# Patient Record
Sex: Female | Born: 1940 | Race: White | Hispanic: No | Marital: Married | Smoking: Current every day smoker
Health system: Southern US, Community
[De-identification: ages and names within clinical notes are randomized; demographics above are authoritative.]

## PROBLEM LIST (undated history)

## (undated) DIAGNOSIS — R350 Frequency of micturition: Secondary | ICD-10-CM

## (undated) DIAGNOSIS — R5381 Other malaise: Secondary | ICD-10-CM

## (undated) DIAGNOSIS — E559 Vitamin D deficiency, unspecified: Secondary | ICD-10-CM

## (undated) DIAGNOSIS — M545 Low back pain, unspecified: Secondary | ICD-10-CM

## (undated) DIAGNOSIS — R946 Abnormal results of thyroid function studies: Secondary | ICD-10-CM

## (undated) DIAGNOSIS — K900 Celiac disease: Secondary | ICD-10-CM

## (undated) DIAGNOSIS — M2550 Pain in unspecified joint: Secondary | ICD-10-CM

## (undated) DIAGNOSIS — K219 Gastro-esophageal reflux disease without esophagitis: Secondary | ICD-10-CM

## (undated) DIAGNOSIS — R5383 Other fatigue: Secondary | ICD-10-CM

## (undated) DIAGNOSIS — R51 Headache: Secondary | ICD-10-CM

## (undated) DIAGNOSIS — N951 Menopausal and female climacteric states: Secondary | ICD-10-CM

## (undated) DIAGNOSIS — M1990 Unspecified osteoarthritis, unspecified site: Secondary | ICD-10-CM

## (undated) DIAGNOSIS — N3090 Cystitis, unspecified without hematuria: Secondary | ICD-10-CM

## (undated) DIAGNOSIS — J301 Allergic rhinitis due to pollen: Secondary | ICD-10-CM

## (undated) DIAGNOSIS — M8580 Other specified disorders of bone density and structure, unspecified site: Secondary | ICD-10-CM

## (undated) DIAGNOSIS — I00 Rheumatic fever without heart involvement: Secondary | ICD-10-CM

## (undated) DIAGNOSIS — J45909 Unspecified asthma, uncomplicated: Secondary | ICD-10-CM

## (undated) DIAGNOSIS — I059 Rheumatic mitral valve disease, unspecified: Secondary | ICD-10-CM

## (undated) HISTORY — PX: TONSILLECTOMY: SUR1361

## (undated) HISTORY — DX: Pain in unspecified joint: M25.50

## (undated) HISTORY — DX: Other malaise: R53.81

## (undated) HISTORY — DX: Menopausal and female climacteric states: N95.1

## (undated) HISTORY — DX: Allergic rhinitis due to pollen: J30.1

## (undated) HISTORY — DX: Unspecified osteoarthritis, unspecified site: M19.90

## (undated) HISTORY — DX: Celiac disease: K90.0

## (undated) HISTORY — DX: Rheumatic mitral valve disease, unspecified: I05.9

## (undated) HISTORY — DX: Other specified disorders of bone density and structure, unspecified site: M85.80

## (undated) HISTORY — DX: Rheumatic fever without heart involvement: I00

## (undated) HISTORY — PX: TOTAL ABDOMINAL HYSTERECTOMY W/ BILATERAL SALPINGOOPHORECTOMY: SHX83

## (undated) HISTORY — DX: Other fatigue: R53.83

## (undated) HISTORY — DX: Low back pain, unspecified: M54.50

## (undated) HISTORY — PX: APPENDECTOMY: SHX54

## (undated) HISTORY — DX: Low back pain: M54.5

## (undated) HISTORY — DX: Frequency of micturition: R35.0

## (undated) HISTORY — DX: Headache: R51

## (undated) HISTORY — DX: Abnormal results of thyroid function studies: R94.6

## (undated) HISTORY — DX: Cystitis, unspecified without hematuria: N30.90

## (undated) HISTORY — DX: Gastro-esophageal reflux disease without esophagitis: K21.9

## (undated) HISTORY — DX: Unspecified asthma, uncomplicated: J45.909

## (undated) HISTORY — DX: Vitamin D deficiency, unspecified: E55.9

---

## 2002-04-01 ENCOUNTER — Ambulatory Visit (HOSPITAL_COMMUNITY): Admission: RE | Admit: 2002-04-01 | Discharge: 2002-04-01 | Payer: Self-pay | Admitting: Internal Medicine

## 2004-12-23 ENCOUNTER — Ambulatory Visit: Payer: Self-pay | Admitting: Family Medicine

## 2007-03-15 HISTORY — PX: BREAST BIOPSY: SHX20

## 2007-04-16 ENCOUNTER — Ambulatory Visit: Payer: Self-pay | Admitting: Endocrinology

## 2007-10-15 ENCOUNTER — Ambulatory Visit: Payer: Self-pay | Admitting: General Surgery

## 2008-03-14 HISTORY — PX: ANKLE SURGERY: SHX546

## 2008-03-19 ENCOUNTER — Ambulatory Visit: Payer: Self-pay | Admitting: Urology

## 2008-12-10 ENCOUNTER — Ambulatory Visit (HOSPITAL_BASED_OUTPATIENT_CLINIC_OR_DEPARTMENT_OTHER): Admission: RE | Admit: 2008-12-10 | Discharge: 2008-12-10 | Payer: Self-pay | Admitting: Orthopedic Surgery

## 2009-05-13 ENCOUNTER — Ambulatory Visit: Payer: Self-pay | Admitting: Family Medicine

## 2009-12-23 ENCOUNTER — Ambulatory Visit: Payer: Self-pay | Admitting: Family Medicine

## 2010-02-23 ENCOUNTER — Observation Stay: Payer: Self-pay | Admitting: Internal Medicine

## 2010-03-14 LAB — HM COLONOSCOPY

## 2010-03-30 ENCOUNTER — Other Ambulatory Visit: Payer: Self-pay | Admitting: Internal Medicine

## 2010-04-01 ENCOUNTER — Ambulatory Visit: Payer: Self-pay | Admitting: Internal Medicine

## 2010-04-05 ENCOUNTER — Ambulatory Visit: Payer: Self-pay | Admitting: Internal Medicine

## 2010-04-29 ENCOUNTER — Ambulatory Visit: Payer: Self-pay | Admitting: Gastroenterology

## 2010-05-03 ENCOUNTER — Ambulatory Visit: Payer: Self-pay | Admitting: Gastroenterology

## 2010-05-14 ENCOUNTER — Ambulatory Visit: Payer: Self-pay | Admitting: Gastroenterology

## 2010-05-21 ENCOUNTER — Ambulatory Visit: Payer: Self-pay | Admitting: Gastroenterology

## 2010-09-03 ENCOUNTER — Ambulatory Visit: Payer: Self-pay | Admitting: Gastroenterology

## 2010-11-23 ENCOUNTER — Emergency Department: Payer: Self-pay | Admitting: Emergency Medicine

## 2010-12-08 ENCOUNTER — Ambulatory Visit: Payer: Self-pay | Admitting: Family Medicine

## 2010-12-14 ENCOUNTER — Ambulatory Visit: Payer: Self-pay | Admitting: Family Medicine

## 2010-12-28 ENCOUNTER — Ambulatory Visit: Payer: Self-pay | Admitting: Family Medicine

## 2010-12-28 LAB — HM DEXA SCAN

## 2011-01-10 ENCOUNTER — Ambulatory Visit: Payer: Self-pay | Admitting: Family Medicine

## 2011-03-01 ENCOUNTER — Ambulatory Visit: Payer: Self-pay | Admitting: Gastroenterology

## 2011-04-18 ENCOUNTER — Ambulatory Visit: Payer: Self-pay | Admitting: Family Medicine

## 2011-06-23 ENCOUNTER — Ambulatory Visit: Payer: Self-pay | Admitting: Gastroenterology

## 2011-12-06 ENCOUNTER — Encounter: Payer: Self-pay | Admitting: Family Medicine

## 2011-12-06 ENCOUNTER — Encounter: Payer: Self-pay | Admitting: *Deleted

## 2012-01-14 ENCOUNTER — Emergency Department: Payer: Self-pay | Admitting: Emergency Medicine

## 2012-01-14 LAB — CBC
HCT: 38.9 %
HGB: 13.3 g/dL
MCH: 30 pg
MCHC: 34.3 g/dL
MCV: 87 fL
Platelet: 276 x10 3/mm 3
RBC: 4.45 X10 6/mm 3
RDW: 12.6 %
WBC: 8.3 x10 3/mm 3

## 2012-01-14 LAB — TROPONIN I: Troponin-I: <0.02 ng/mL

## 2012-01-14 LAB — BASIC METABOLIC PANEL WITH GFR
Anion Gap: 11
BUN: 20 mg/dL — ABNORMAL HIGH
Calcium, Total: 8.9 mg/dL
Chloride: 106 mmol/L
Co2: 23 mmol/L
Creatinine: 0.9 mg/dL
EGFR (African American): >60
EGFR (Non-African Amer.): >60
Glucose: 103 mg/dL — ABNORMAL HIGH
Osmolality: 282
Potassium: 3.9 mmol/L
Sodium: 140 mmol/L

## 2012-01-14 LAB — CK TOTAL AND CKMB (NOT AT ARMC): CK-MB: 1.5 ng/mL (ref 0.5–3.6)

## 2012-02-10 ENCOUNTER — Ambulatory Visit: Payer: Self-pay | Admitting: Internal Medicine

## 2012-02-23 ENCOUNTER — Ambulatory Visit: Payer: Self-pay | Admitting: Cardiothoracic Surgery

## 2012-02-23 ENCOUNTER — Ambulatory Visit: Payer: Self-pay

## 2012-02-23 LAB — CBC WITH DIFFERENTIAL/PLATELET
Basophil %: 0.6 %
Eosinophil %: 2.7 %
HGB: 13.9 g/dL (ref 12.0–16.0)
Lymphocyte #: 2.1 10*3/uL (ref 1.0–3.6)
MCH: 30.1 pg (ref 26.0–34.0)
MCV: 88 fL (ref 80–100)
Monocyte #: 0.5 x10 3/mm (ref 0.2–0.9)
Monocyte %: 7.8 %
Neutrophil #: 3.9 10*3/uL (ref 1.4–6.5)
RBC: 4.6 10*6/uL (ref 3.80–5.20)

## 2012-02-23 LAB — COMPREHENSIVE METABOLIC PANEL
Albumin: 3.8 g/dL (ref 3.4–5.0)
Alkaline Phosphatase: 110 U/L (ref 50–136)
BUN: 17 mg/dL (ref 7–18)
EGFR (African American): >60
Glucose: 96 mg/dL (ref 65–99)
SGOT(AST): 32 U/L (ref 15–37)
SGPT (ALT): 31 U/L (ref 12–78)
Total Protein: 7.1 g/dL (ref 6.4–8.2)

## 2012-02-23 LAB — PROTIME-INR: INR: 0.9

## 2012-03-11 IMAGING — CR DG THORACIC SPINE 2-3V
1 series · 4 of 4 positions shown · non-contrast
Comparison: none

REASON FOR EXAM: back pain
COMMENTS:

[Series 1: ap · 0.17mm/px · 4 of 4 slices shown]
[im 1/4]
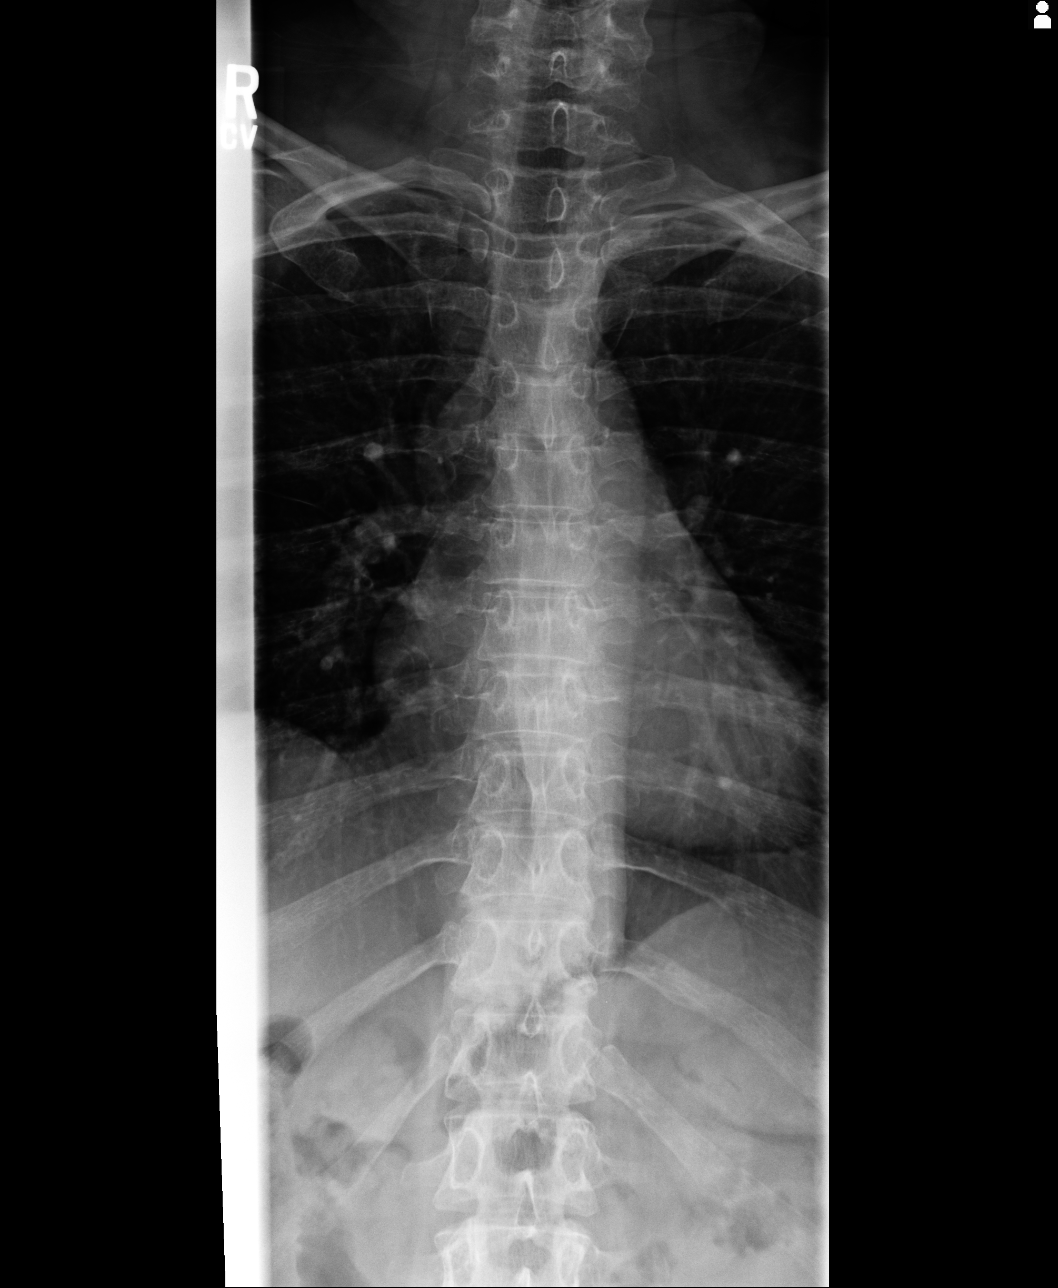
[im 2/4]
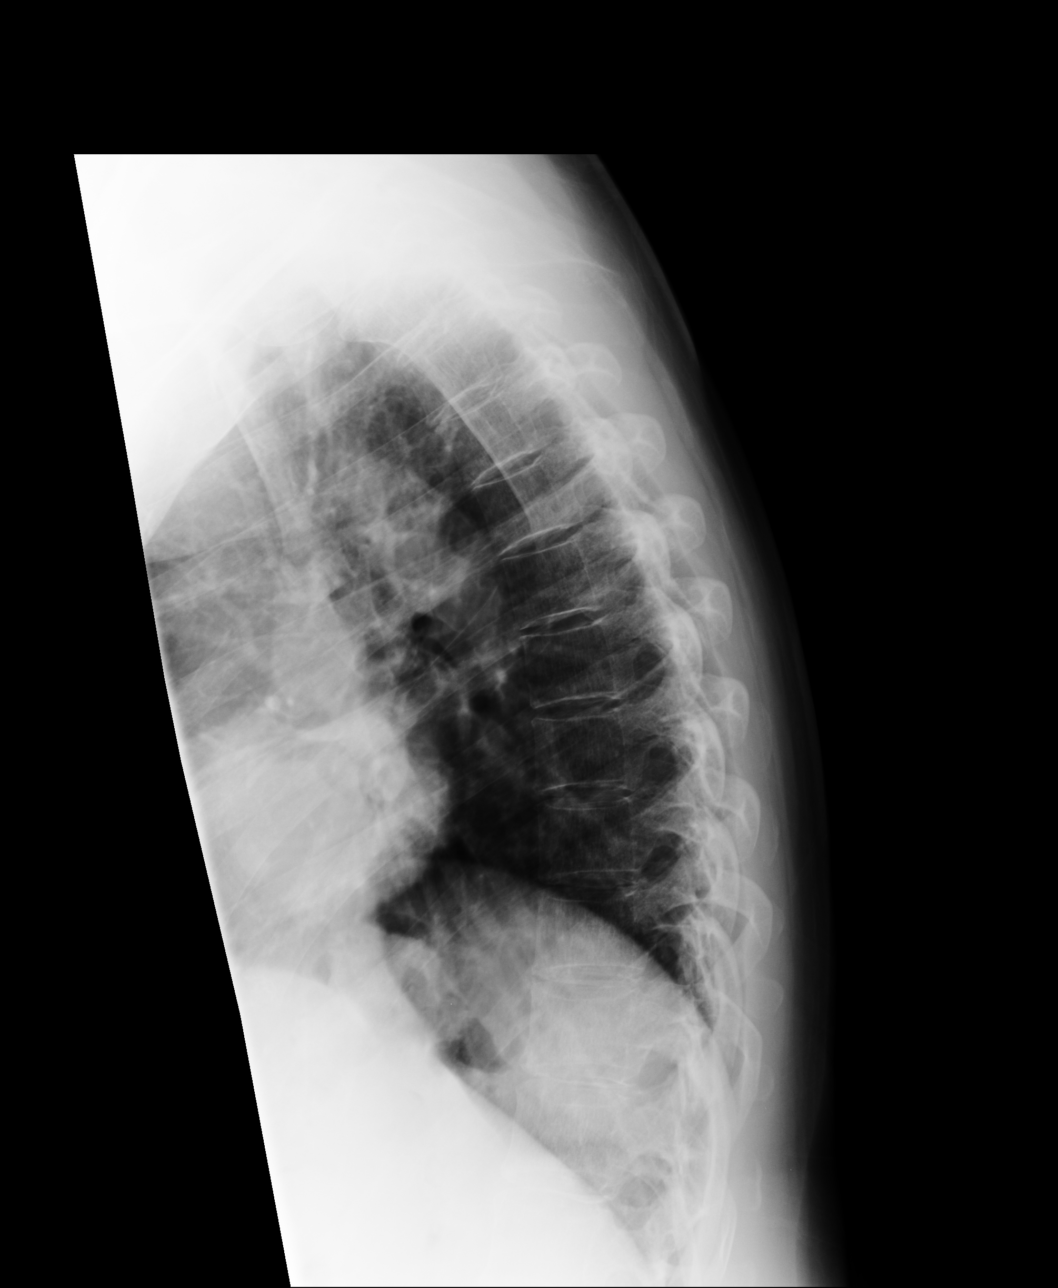
[im 3/4]
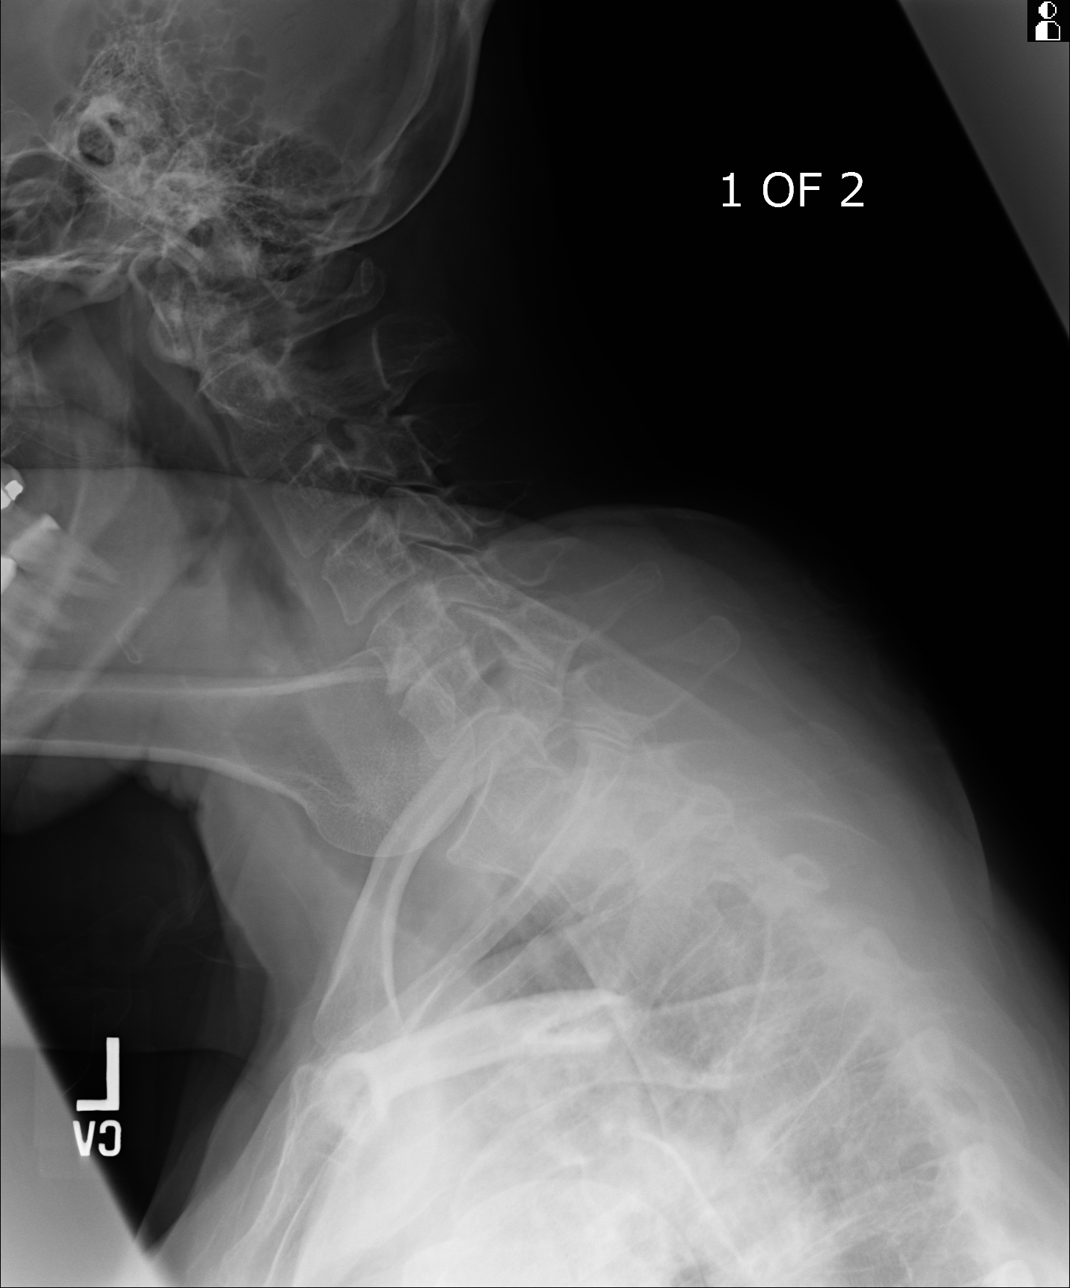
[im 4/4]
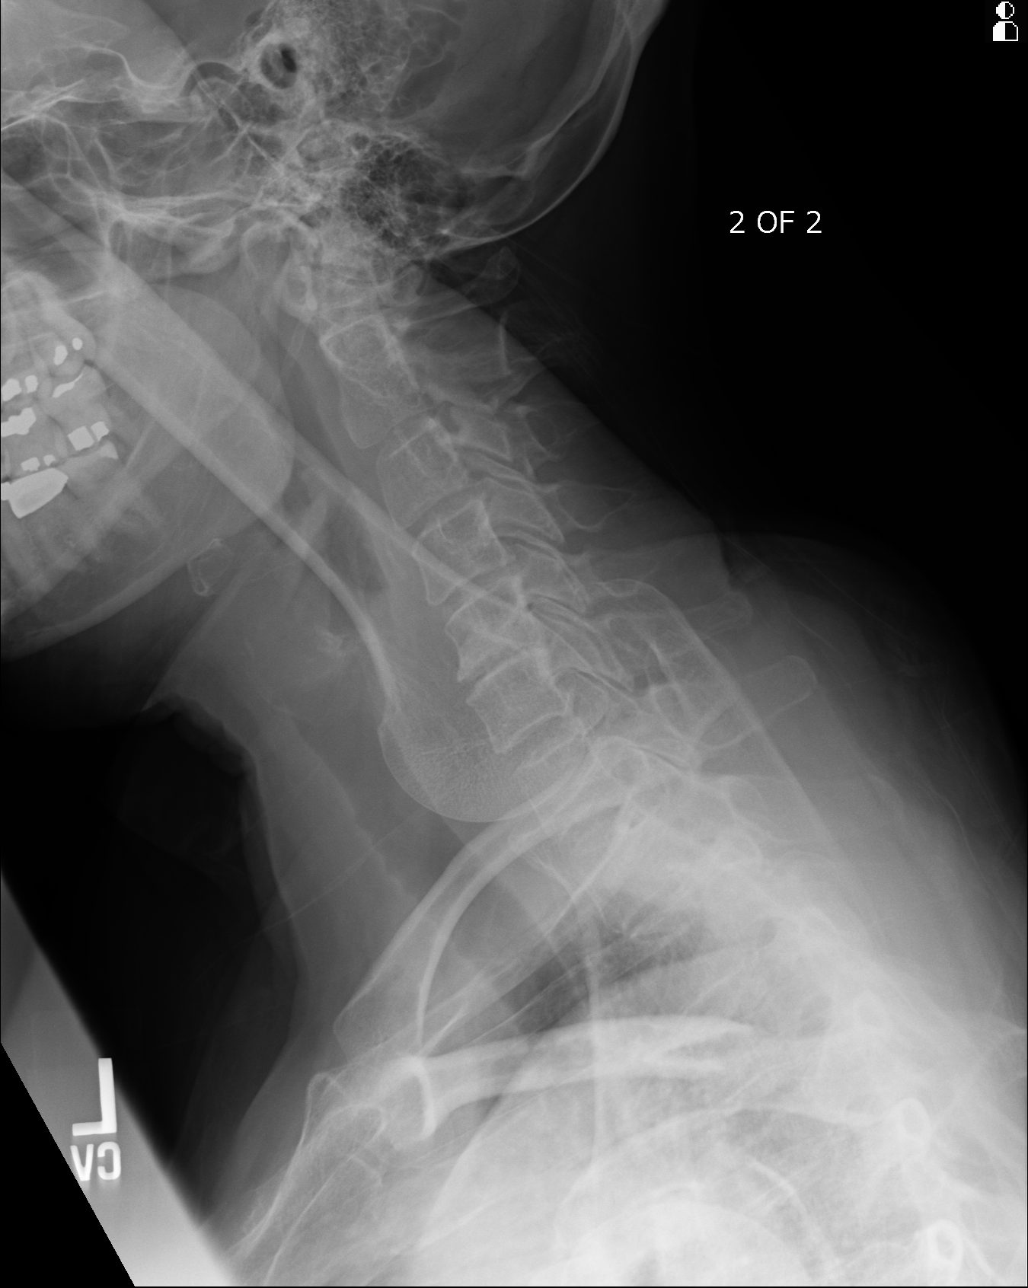

[4 of 4 positions shown; findings below may reference images not displayed]

PROCEDURE:     KDR - KDXR THORACIC AP AND LATERAL  - April 18, 2011  [DATE]

RESULT:     The vertebral body heights and the intervertebral disc spaces
are well maintained. The vertebral body alignment is normal. The pedicles
are bilaterally intact. The lower cervical spine is also visualized in this
exam and there is noted narrowing of the C5-C6 cervical disc space.
IMPRESSION: 1. No significant osseous abnormalities of the thoracic spine are identified.
2. Incidental note is made of narrowing of the C5-C6 cervical disc space
consistent with cervical disc disease.

## 2012-03-12 ENCOUNTER — Inpatient Hospital Stay: Payer: Self-pay

## 2012-03-13 LAB — CBC WITH DIFFERENTIAL/PLATELET
Basophil #: 0 10*3/uL (ref 0.0–0.1)
Basophil %: 0.2 %
Eosinophil #: 0.1 10*3/uL (ref 0.0–0.7)
Eosinophil %: 0.6 %
HCT: 41 % (ref 35.0–47.0)
HGB: 13.9 g/dL (ref 12.0–16.0)
Lymphocyte #: 1.6 10*3/uL (ref 1.0–3.6)
Lymphocyte %: 14.1 %
MCH: 30 pg (ref 26.0–34.0)
MCHC: 34 g/dL (ref 32.0–36.0)
Monocyte #: 0.8 x10 3/mm (ref 0.2–0.9)
Monocyte %: 7.5 %
Neutrophil #: 8.6 10*3/uL — ABNORMAL HIGH (ref 1.4–6.5)
WBC: 11.1 10*3/uL — ABNORMAL HIGH (ref 3.6–11.0)

## 2012-03-13 LAB — BASIC METABOLIC PANEL
Anion Gap: 6 — ABNORMAL LOW (ref 7–16)
BUN: 10 mg/dL (ref 7–18)
Chloride: 102 mmol/L (ref 98–107)
Co2: 25 mmol/L (ref 21–32)
Creatinine: 0.68 mg/dL (ref 0.60–1.30)
EGFR (Non-African Amer.): >60
Osmolality: 266 (ref 275–301)
Potassium: 4.8 mmol/L (ref 3.5–5.1)
Sodium: 133 mmol/L — ABNORMAL LOW (ref 136–145)

## 2012-03-16 LAB — MISC AER/ANAEROBIC CULT.

## 2012-03-17 ENCOUNTER — Ambulatory Visit: Payer: Self-pay | Admitting: Cardiothoracic Surgery

## 2012-03-26 ENCOUNTER — Ambulatory Visit: Payer: Self-pay | Admitting: Cardiothoracic Surgery

## 2012-04-02 LAB — CULTURE, FUNGUS WITHOUT SMEAR

## 2012-07-19 ENCOUNTER — Ambulatory Visit: Payer: Self-pay | Admitting: Physician Assistant

## 2012-08-15 ENCOUNTER — Ambulatory Visit: Payer: Self-pay | Admitting: General Surgery

## 2012-09-04 ENCOUNTER — Ambulatory Visit: Payer: Self-pay | Admitting: Internal Medicine

## 2012-09-04 LAB — CREATININE, SERUM
Creatinine: 0.95 mg/dL (ref 0.60–1.30)
EGFR (African American): >60
EGFR (Non-African Amer.): >60

## 2012-12-07 IMAGING — CR DG CHEST 2V
1 series · 2 of 2 positions shown · non-contrast
Comparison: none

REASON FOR EXAM: cough
COMMENTS:

PROCEDURE:     DXR - DXR CHEST PA (OR AP) AND LATERAL  - January 14, 2012  [DATE]
RESULT:     Lungs are clear. Heart size normal. Chest is stable from [id number].

[Series 1: w chest pa · 0.14mm/px · 2 of 2 slices shown]
[im 1/2]
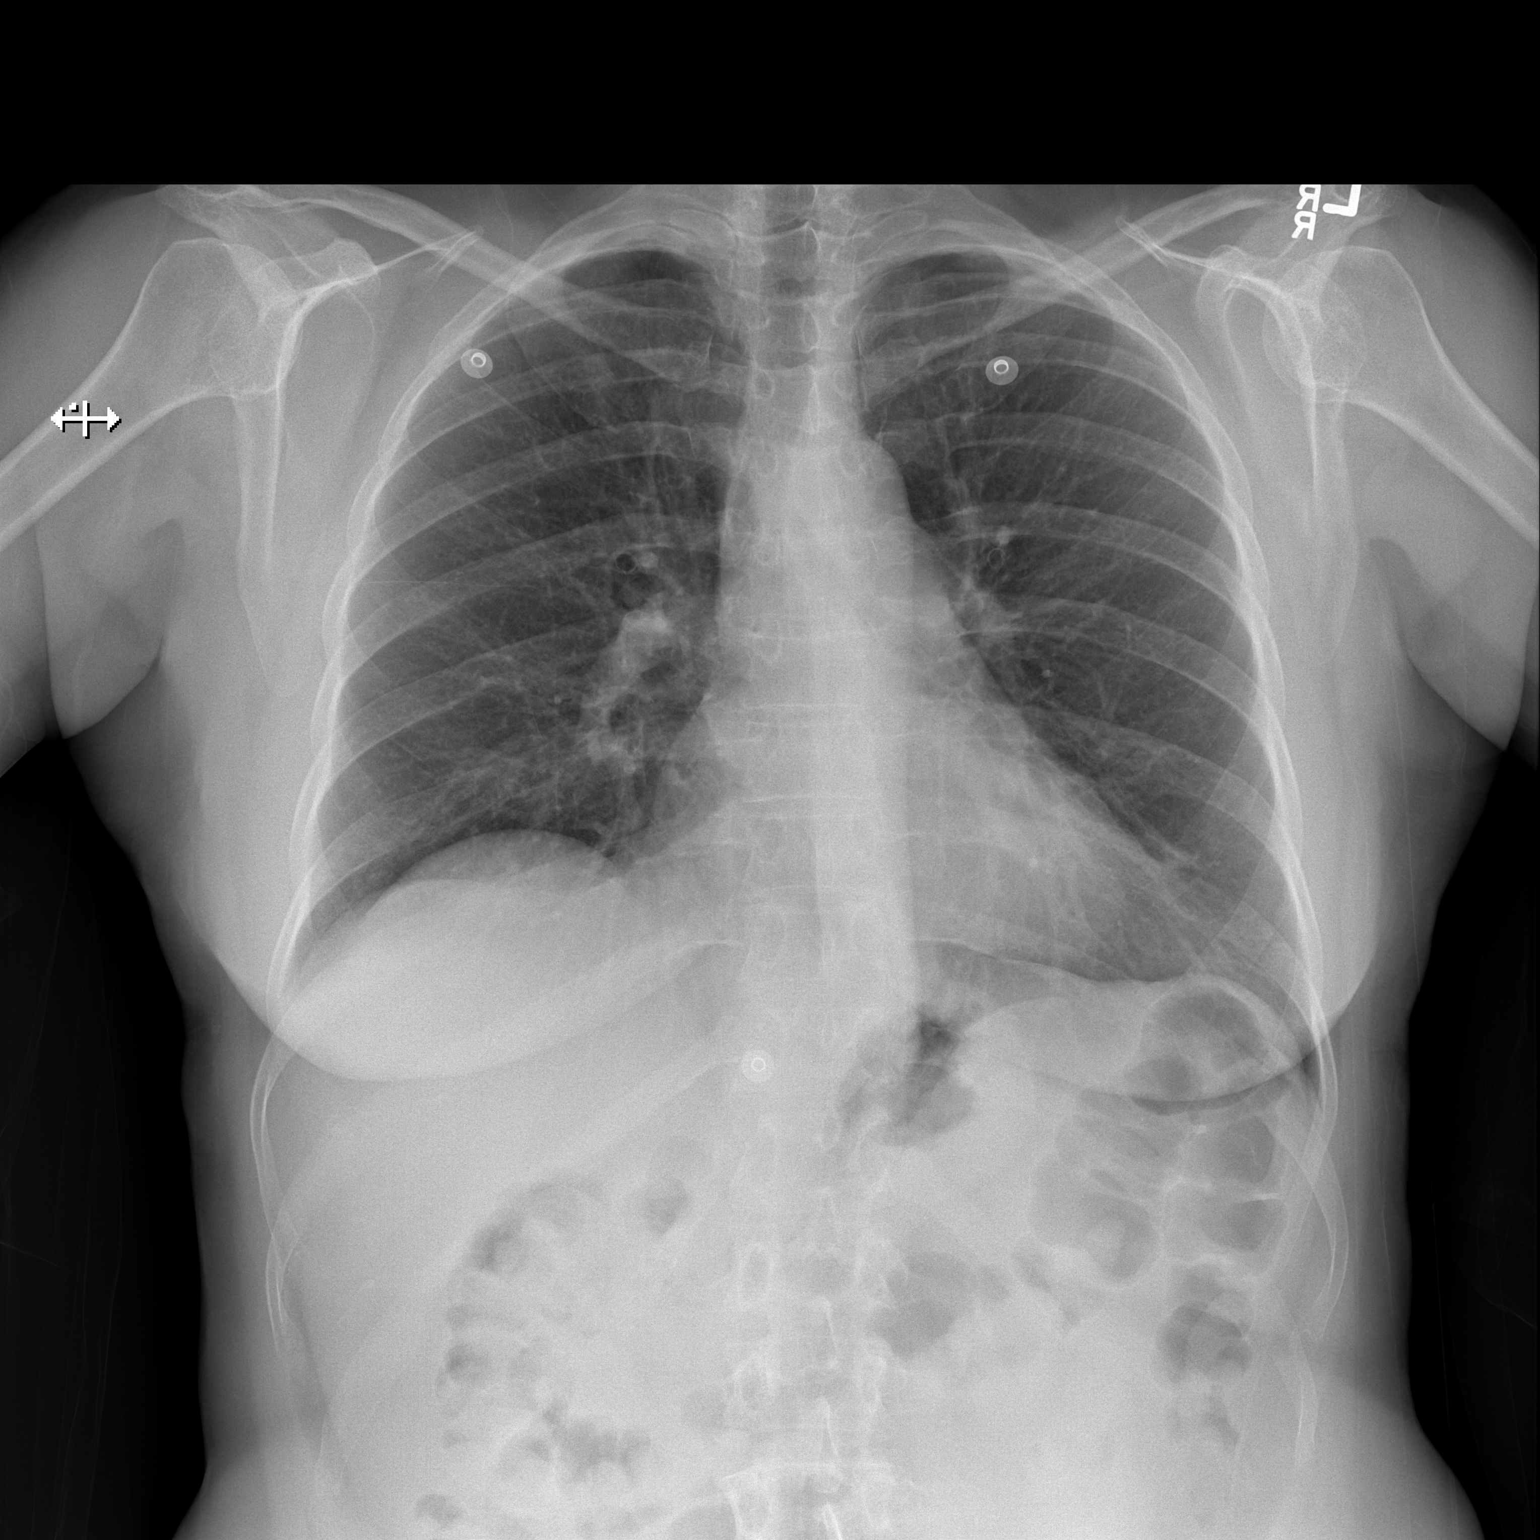
[im 2/2]
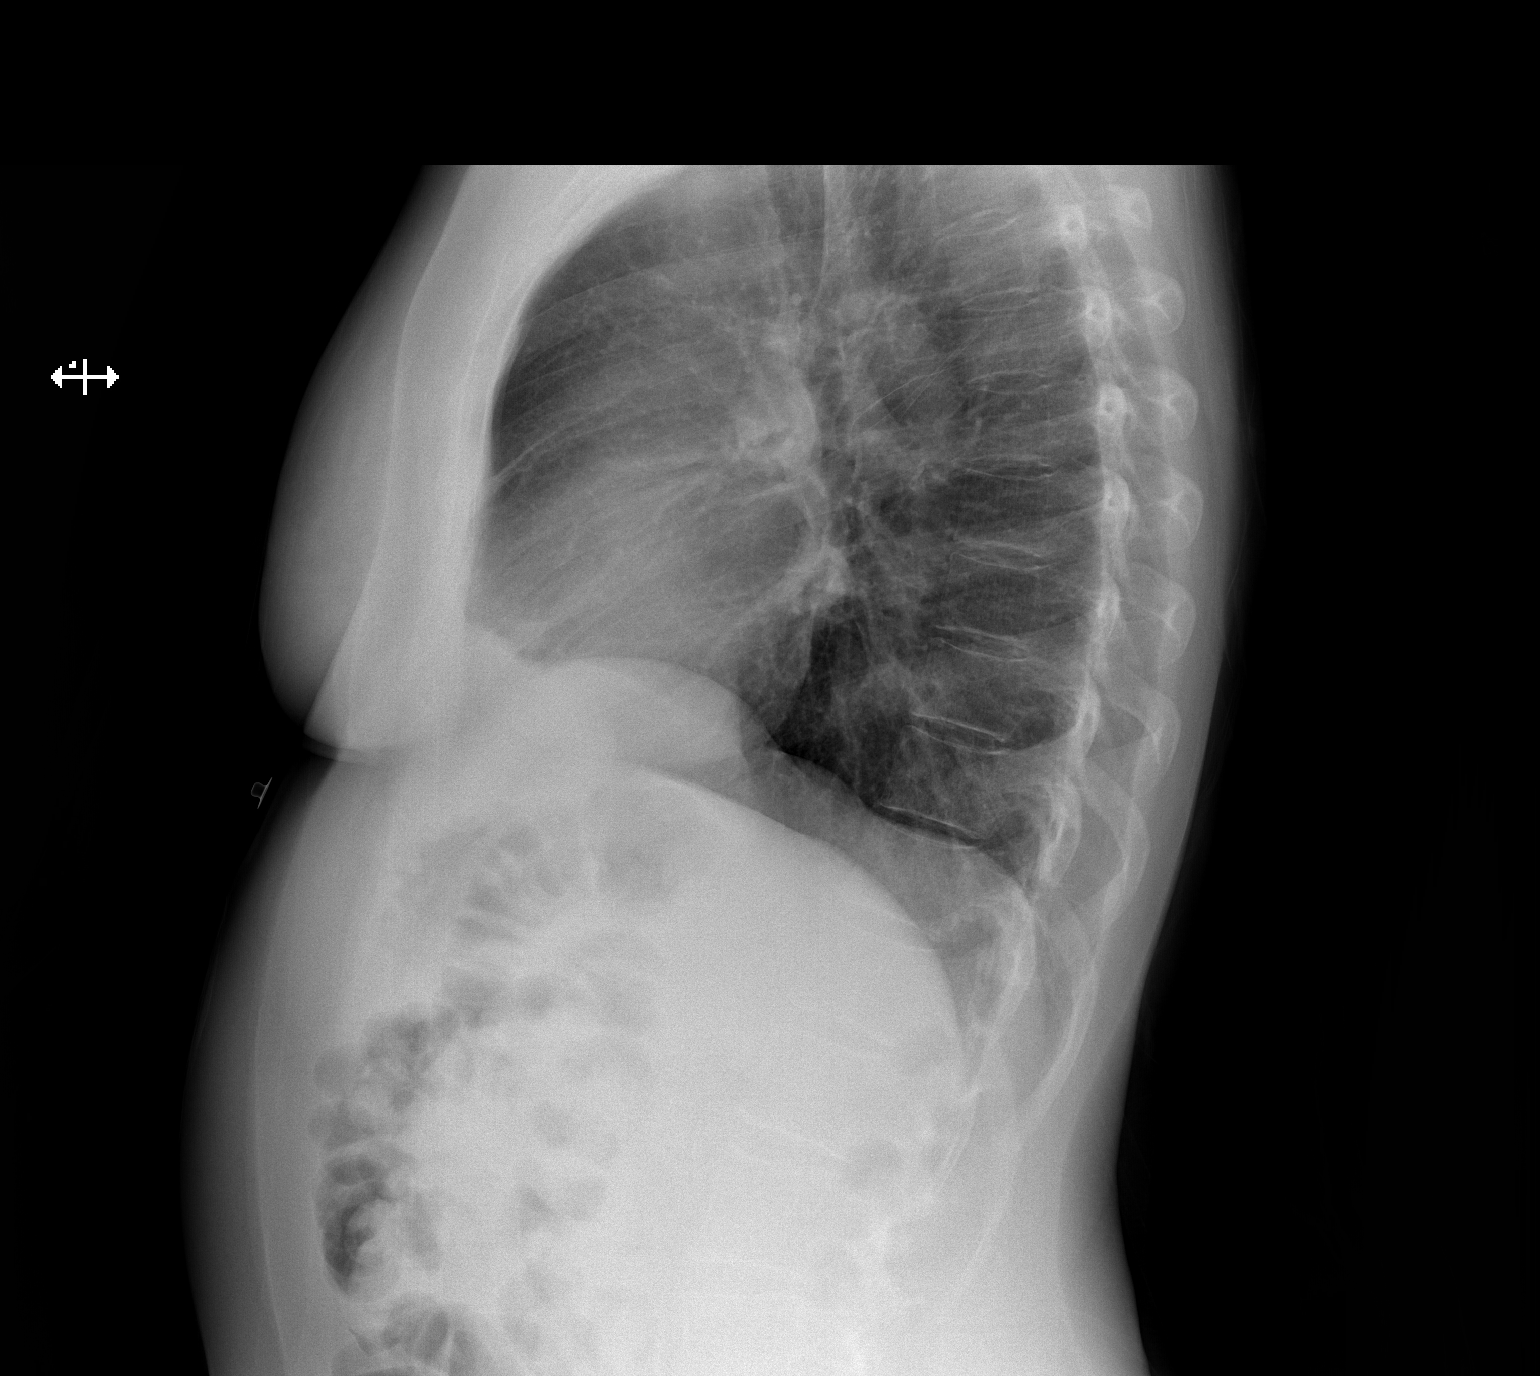

[2 of 2 positions shown; findings below may reference images not displayed]

IMPRESSION: No acute abnormality.

[REDACTED]

## 2013-02-06 IMAGING — CR DG CHEST 2V
1 series · 2 of 2 positions shown · non-contrast
Comparison: none

REASON FOR EXAM: right chest tube on water seal.
COMMENTS:

[Series 1: x chest ap · 0.14mm/px · 2 of 2 slices shown]
[im 1/2]
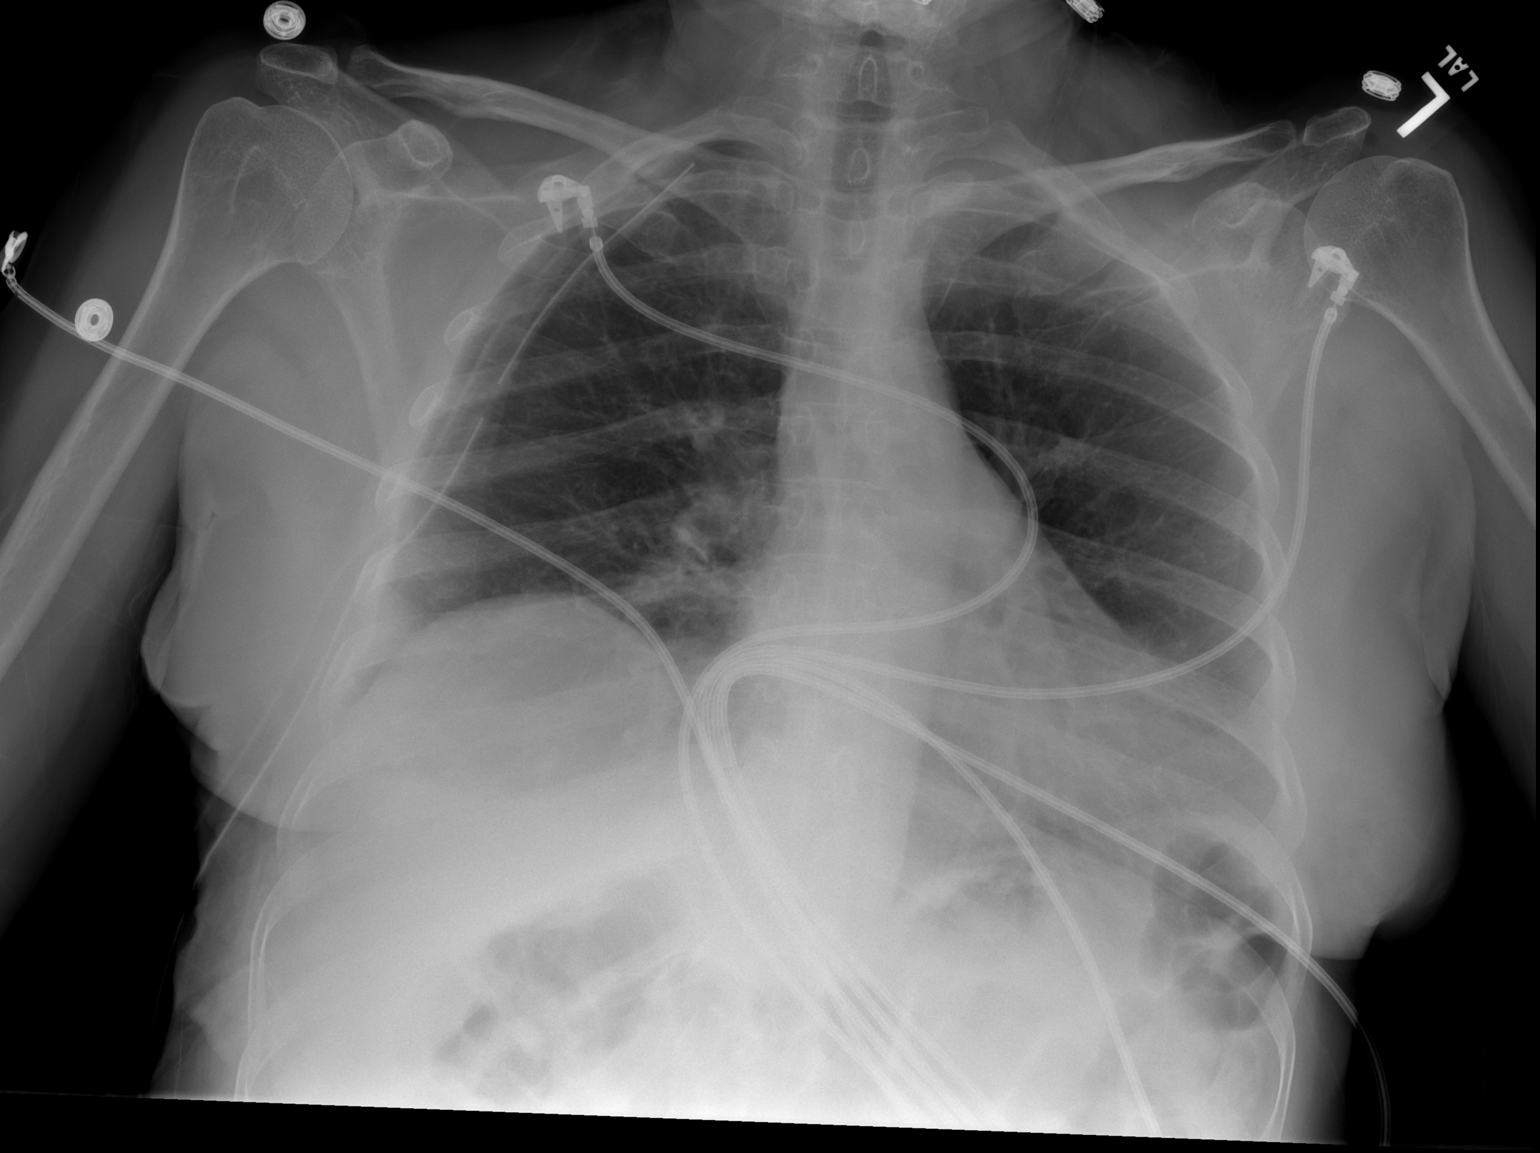
[im 2/2]
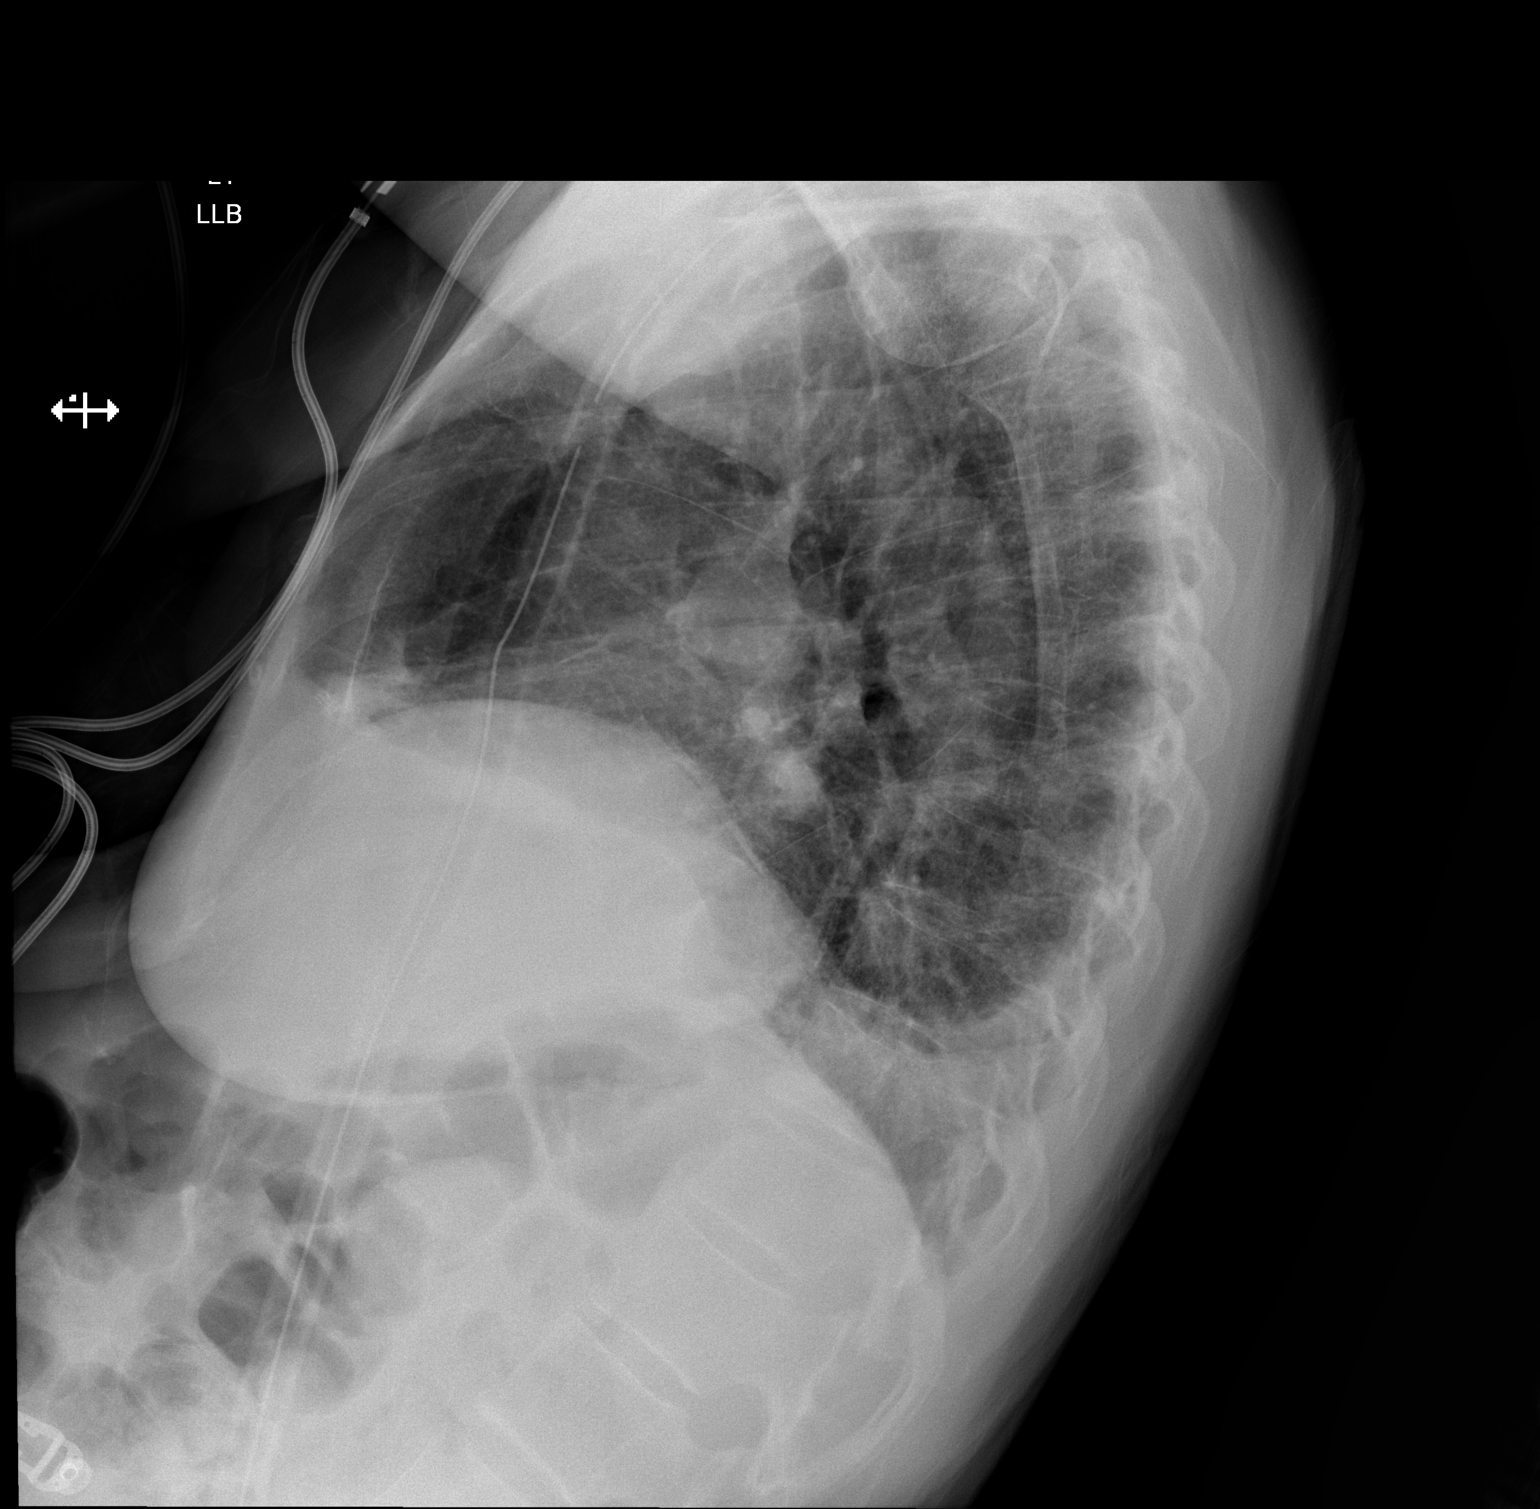

[2 of 2 positions shown; findings below may reference images not displayed]

PROCEDURE:     DXR - DXR CHEST PA (OR AP) AND LATERAL  - March 15, 2012  [DATE]

RESULT:     Comparison is made to the previous exam dated 14 March, 2012.

There is a right-sided chest tube present with the tip at the lung apex.
There is no effusion, pneumothorax or mass. The left lung is clear. The
heart is normal. Cardiac monitoring electrodes are present.
IMPRESSION: 1. No acute cardiopulmonary disease evident. Right-sided chest tube present
without pleural effusion or pneumothorax.

[REDACTED]

## 2013-02-06 IMAGING — CR DG CHEST 2V
1 series · 2 of 2 positions shown · non-contrast
Comparison: none

REASON FOR EXAM: chest tube out
COMMENTS:

[Series 3: x chest ap · 0.14mm/px · 2 of 2 slices shown]
[im 1/2]
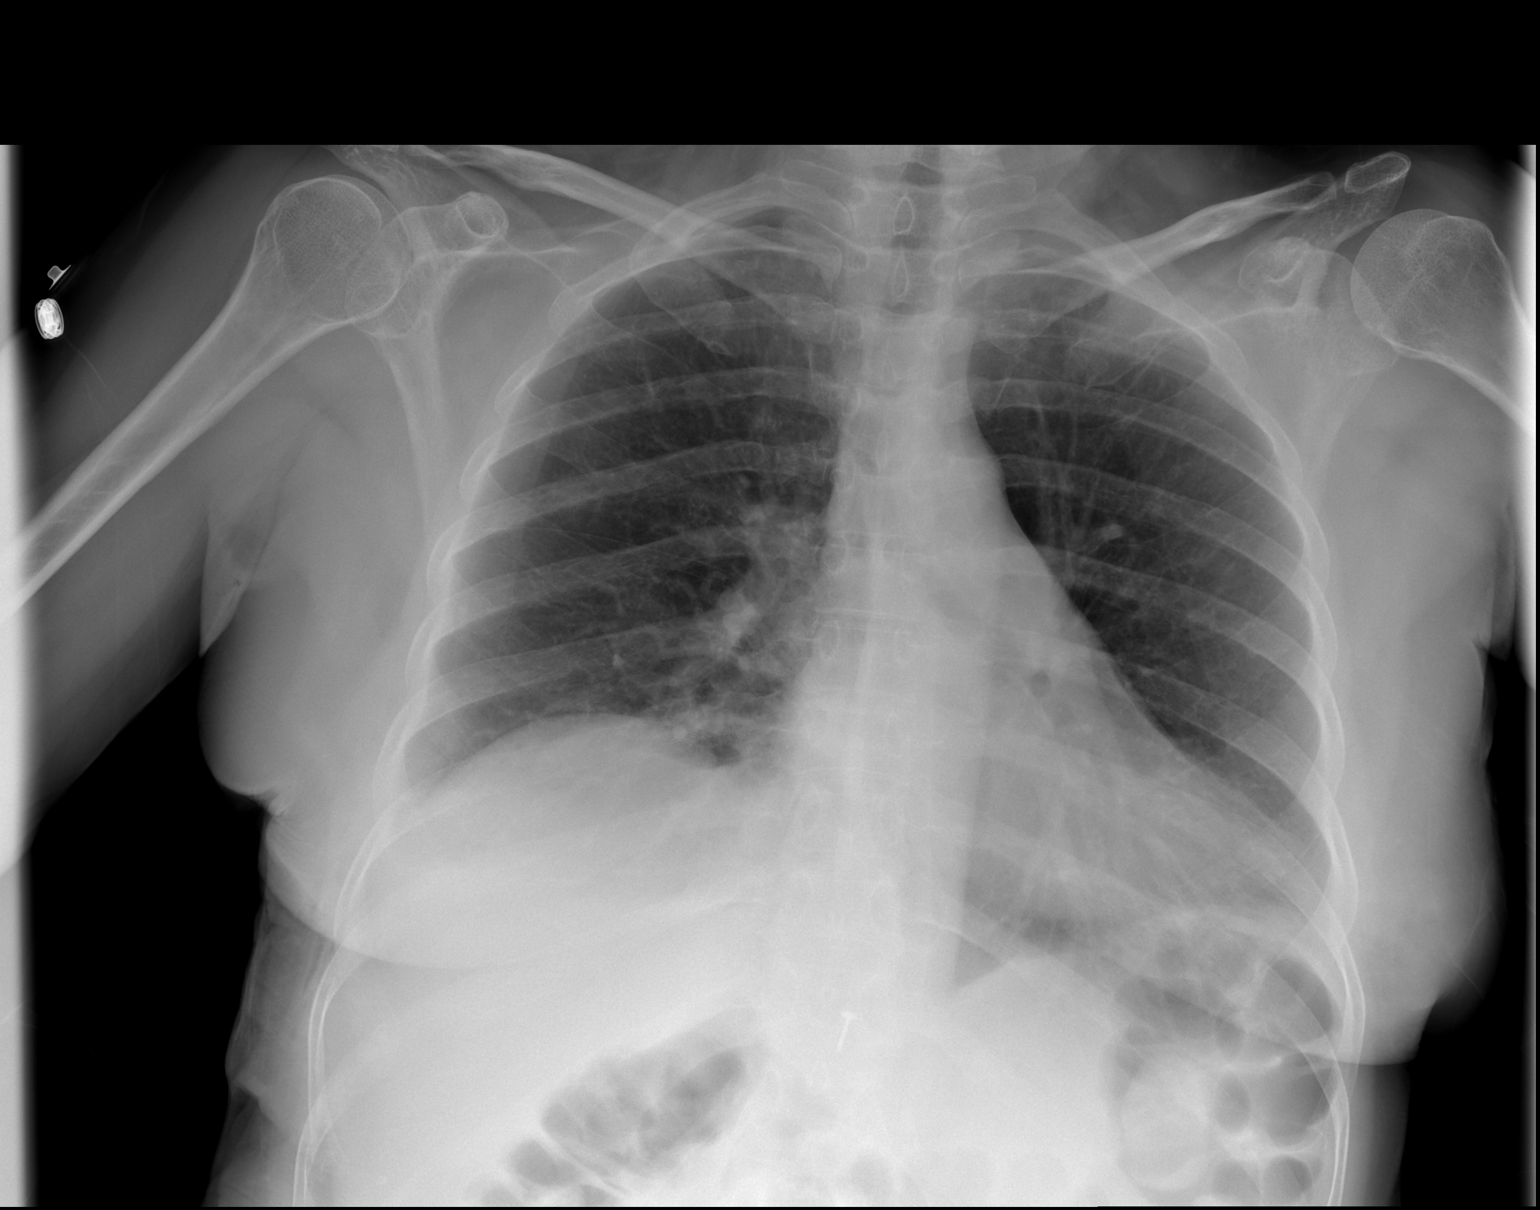
[im 2/2]
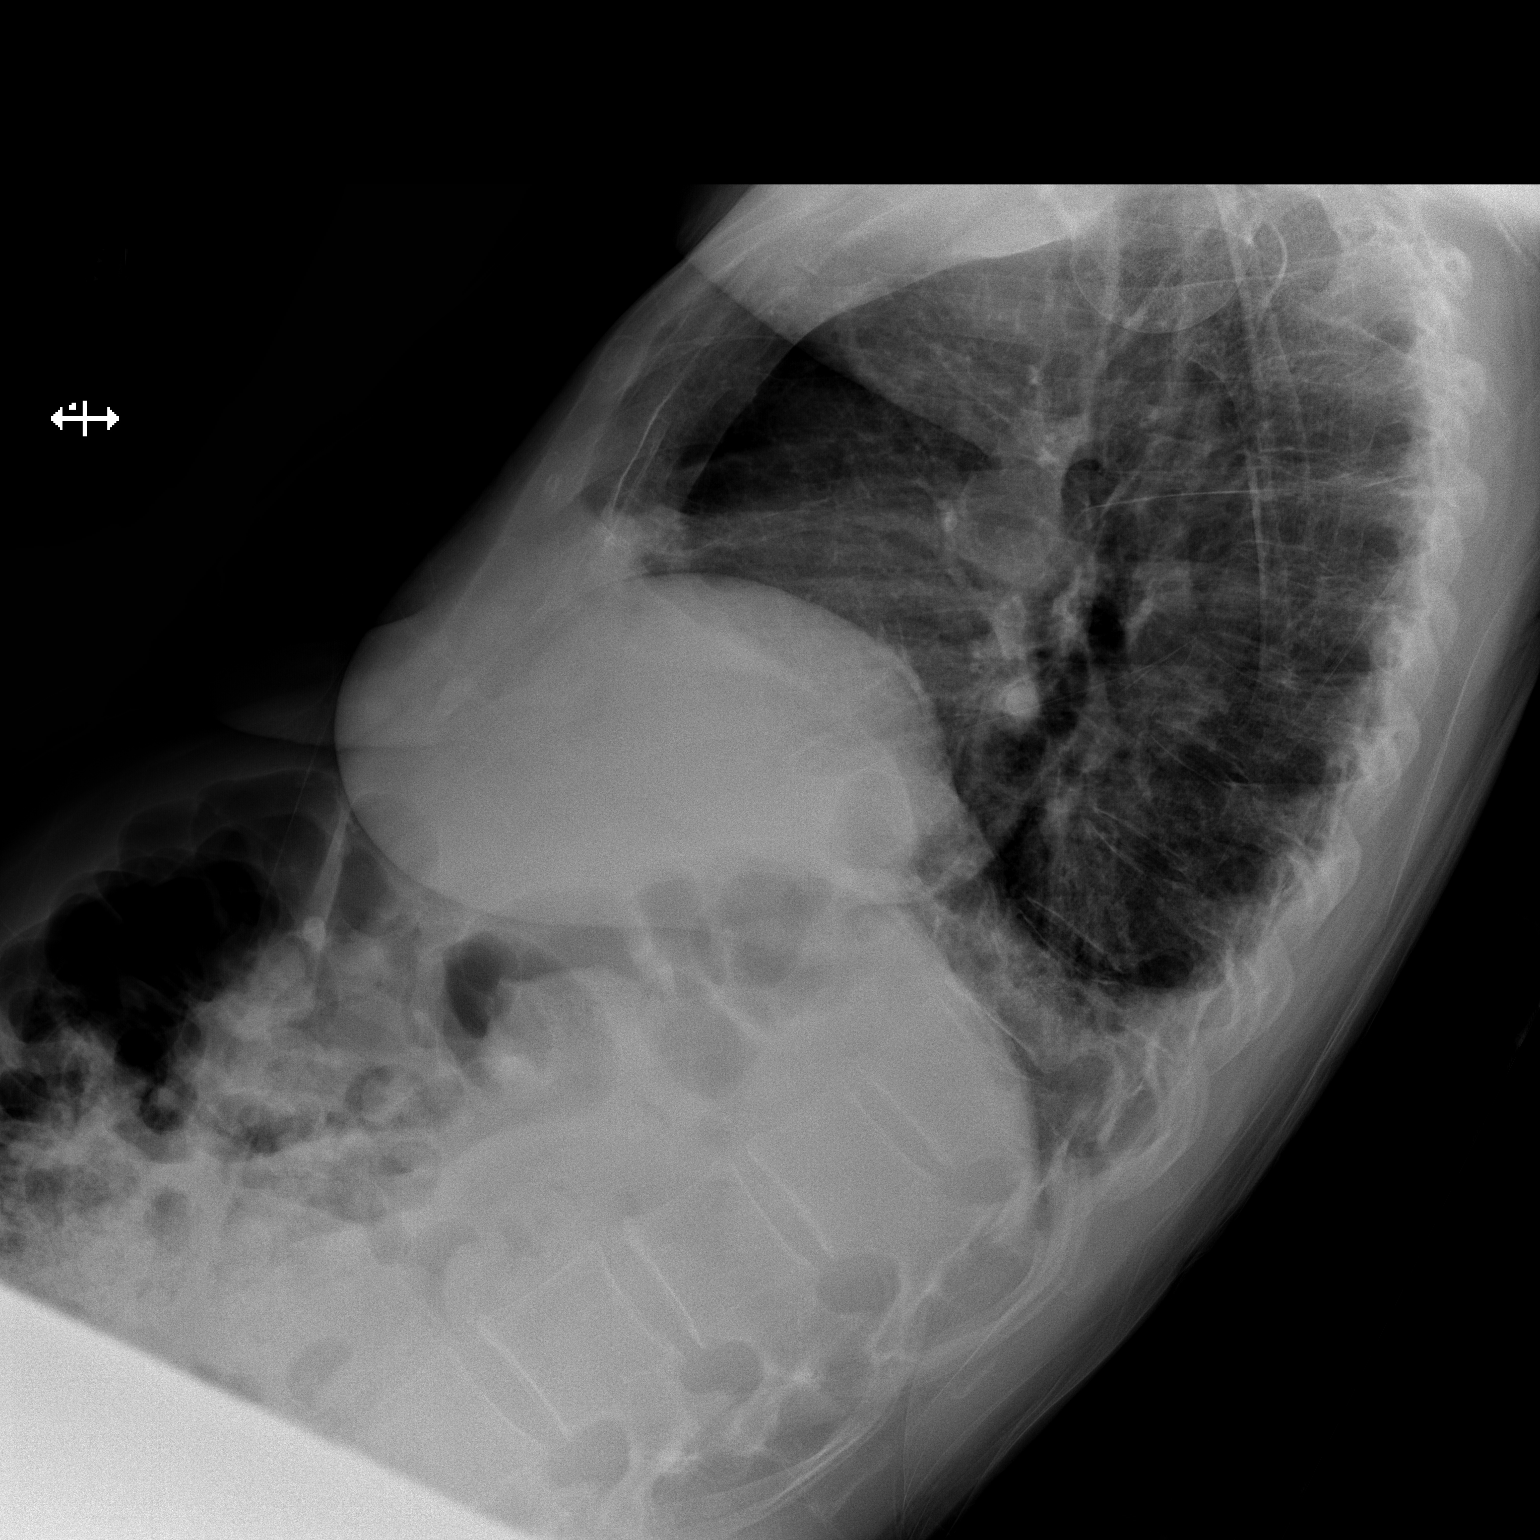

[2 of 2 positions shown; findings below may reference images not displayed]

PROCEDURE:     DXR - DXR CHEST PA (OR AP) AND LATERAL  - March 15, 2012 [DATE]

RESULT:     Right-sided chest tube has been removed. There is minimal
blunting of the costophrenic angle on the right with a trace pleural
effusion seen in the posterior gutter on the lateral view. There is no
pneumothorax. The lungs are otherwise clear. The heart appears normal.
IMPRESSION: Interval removal of right chest tube as described. Trace
right pleural fluid collection. No pneumothorax evident.

[REDACTED]

## 2013-02-17 IMAGING — CR DG CHEST 2V
1 series · 2 of 2 positions shown · non-contrast
Comparison: none

REASON FOR EXAM: POSTINFLAMMATORY PULMONARY FIBROSIS
COMMENTS:

PROCEDURE:     DXR - DXR CHEST PA (OR AP) AND LATERAL  - March 26, 2012 [DATE]
RESULT:     Comparison: None

[Series 1: pa · 0.17mm/px · 2 of 2 slices shown]
[im 1/2]
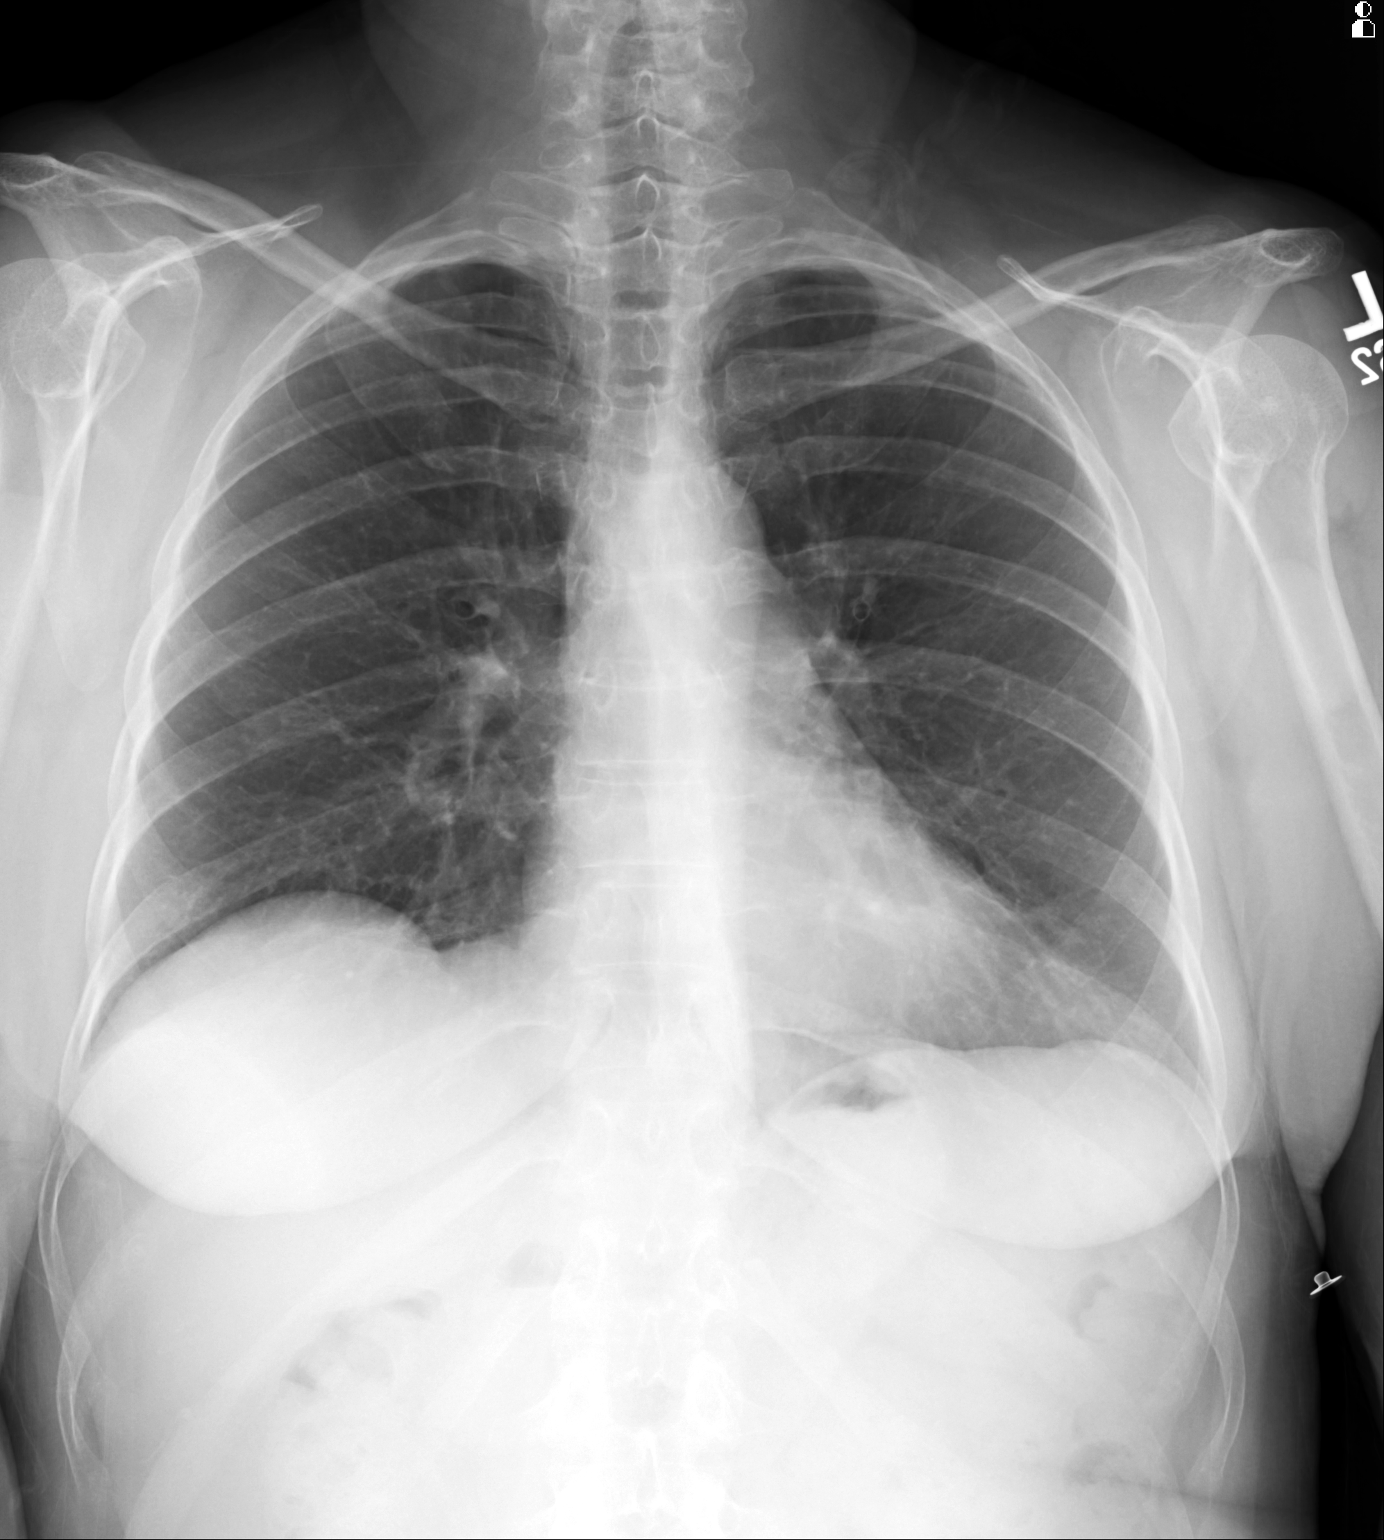
[im 2/2]
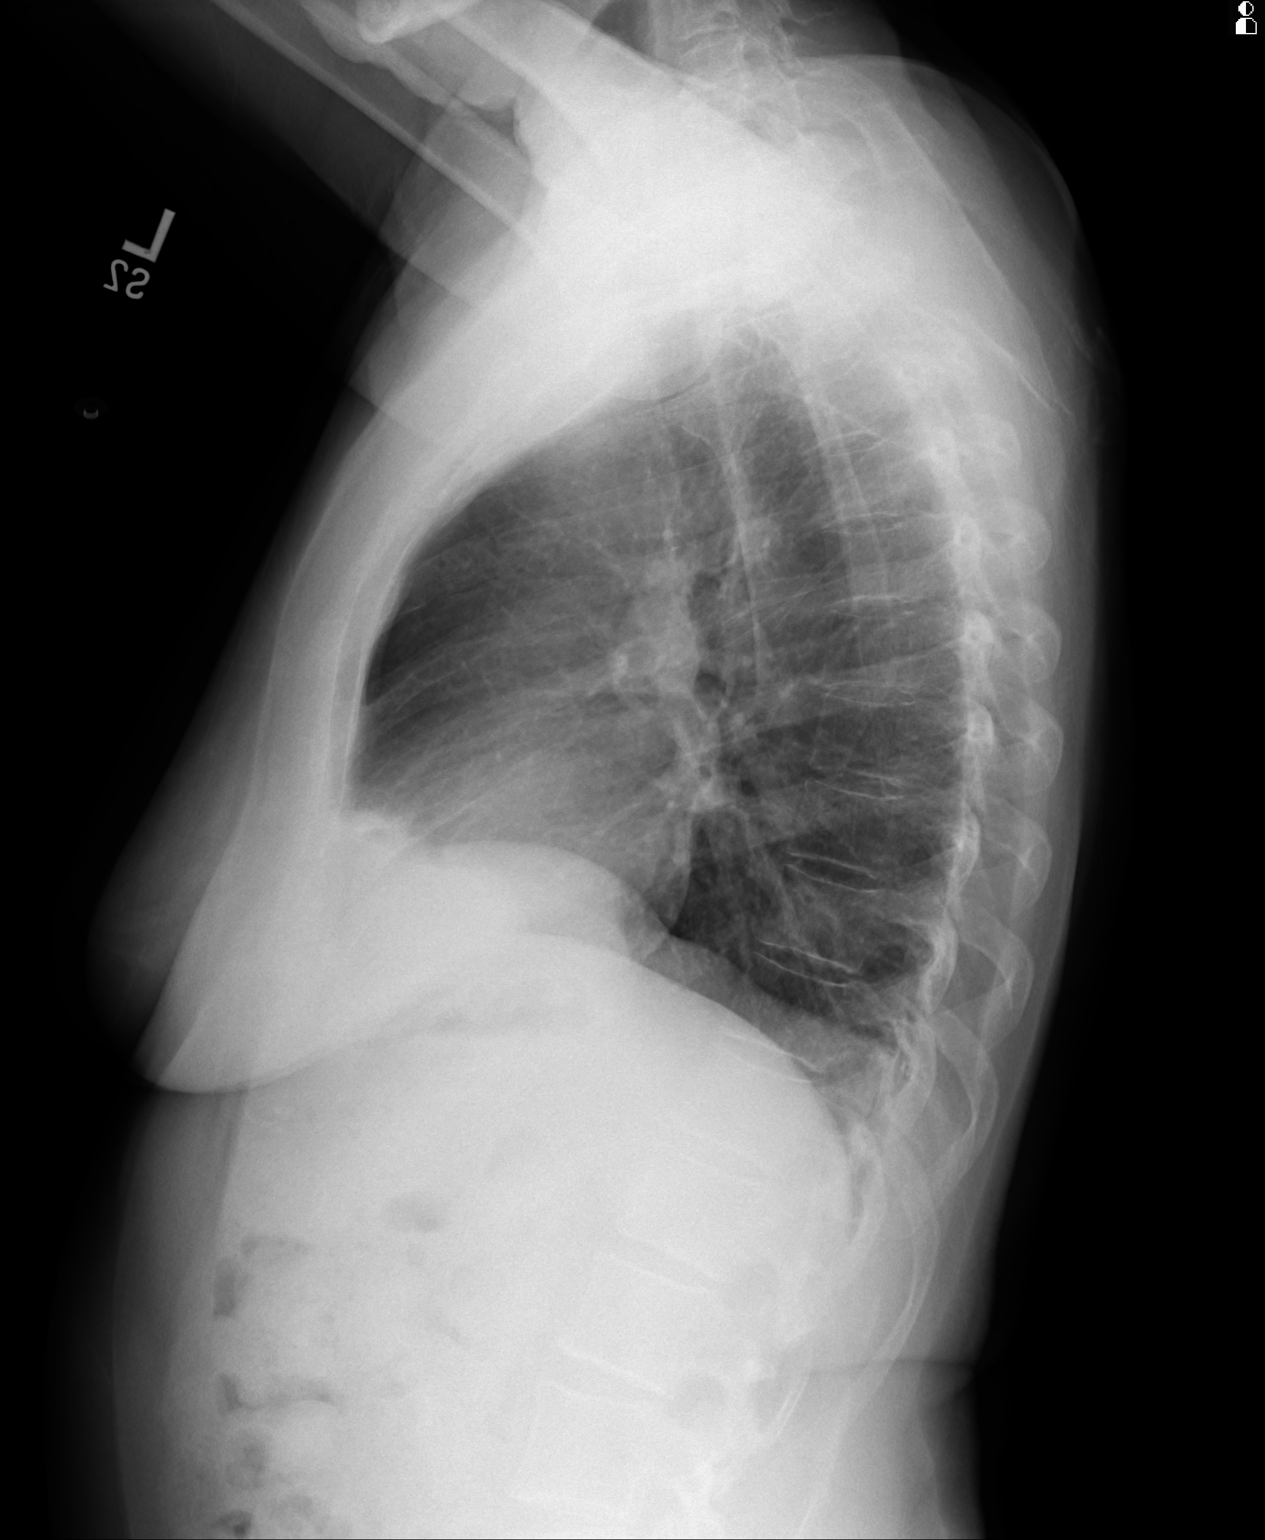

[2 of 2 positions shown; findings below may reference images not displayed]

FINDINGS: PA and lateral chest radiographs are provided.  There is no focal
parenchymal opacity, pleural effusion, or pneumothorax. The heart and
mediastinum are unremarkable.  The osseous structures are unremarkable.
IMPRESSION: No acute disease of the che[REDACTED]

## 2013-08-15 ENCOUNTER — Ambulatory Visit: Payer: Self-pay | Admitting: Internal Medicine

## 2013-11-19 ENCOUNTER — Ambulatory Visit: Payer: Self-pay | Admitting: Gastroenterology

## 2014-01-07 ENCOUNTER — Ambulatory Visit: Payer: Self-pay | Admitting: Internal Medicine

## 2014-05-13 ENCOUNTER — Ambulatory Visit: Payer: Self-pay | Admitting: Internal Medicine

## 2014-07-04 NOTE — Discharge Summary (Signed)
PATIENT NAME:  [name], Shannon S MR#:  [id number] DATE OF BIRTH:  1940/10/05  DATE OF ADMISSION:  03/12/2012 DATE OF DISCHARGE:  03/16/2012  ADMITTING DIAGNOSIS: Interstitial lung disease.   DISCHARGE DIAGNOSIS: Interstitial lung disease.   HOSPITAL COURSE: Shannon Olsen is a 74 year old woman with a history of progressive shortness of breath and cough. She has had an extensive evaluation including a recent bronchoscopy by Dr. Freda MunroSaadat Khan. She had a CT scan which confirmed the presence of possible interstitial lung disease and after extensive evaluation she was admitted to the hospital where she underwent a right thoracoscopy and biopsy of her right middle and lower lobes. The patient was cared for in the recovery room and then transferred to the floor where she did well postop. On 03/15/2012, her chest tube was removed. The wounds were healing as expected and she was ambulatory without oxygen on the 3rd. She was felt ready for discharge at that time. At the time of her discharge, her pathology was still pending.   She was scheduled to follow up with Dr. Thelma Bargeaks in one week. She did not require any new medications. She was scheduled to follow up with Dr. Freda MunroSaadat Khan in 10 days post discharge.  ____________________________ Sheppard Plumberimothy E. Thelma Bargeaks, MD teo:sb D: 03/16/2012 15:49:00 ET T: 03/16/2012 15:56:33 ET JOB#: [id number]  cc: Marcial Pacasimothy E. Thelma Bargeaks, MD, <Dictator> Jasmine DecemberIMOTHY E Kariyah Baugh MD ELECTRONICALLY SIGNED 03/22/2012 9:17

## 2014-07-04 NOTE — Consult Note (Signed)
PATIENT NAME:  Shannon Olsen, Shannon Olsen MR#:  [id number] DATE OF BIRTH:  06/12/1940  DATE OF CONSULTATION:  03/13/2012  REFERRING PHYSICIAN:   CONSULTING PHYSICIAN:  Yevonne PaxSaadat A. Huma Imhoff, MD  REASON FOR CONSULTATION:  Pulmonary fibrosis.  HISTORY OF PRESENT ILLNESS:  This is a 74 year old female who is known to me from the office. I referred her to Dr. Thelma Bargeaks for a lung biopsy. She basically had presented with increasing shortness of breath and CT scan has shown some interstitial lung disease changes. She underwent bronchoscopy, which did not show any significant pathology, so it was opted to go for an open lung biopsy due to her worsening shortness of breath. The patient underwent a biopsy yesterday and results of this are pending. She was successfully extubated and weaned and sent to the floor.  Now that she is on the floor she has a chest tube in place.  There does not appear to be air leaks at this time and the plan is to try to place her on water seal.  I am asked to see her for management of her underlying pulmonary disease.   PAST MEDICAL HISTORY: As already outlined above.   SOCIAL HISTORY: Nonsmoker, does not drink.   FAMILY HISTORY: Noncontributory.   REVIEW OF SYSTEMS:  GENERAL:  She has some pain right now because of the chest tube insertion.  She has also been having worsening of her shortness of breath. The last chest x-ray that had been done revealed some fluid on the right side and chest tube appeared to be in good place and there was no pneumothorax.   MEDICATIONS: Reviewed in the electronic medical record.   ALLERGIES:  Multiple and include: 1.  CODEINE. 2.  EPINEPHRINE. 3.  GLUTEN. 4.  LATEX.    PHYSICAL EXAMINATION: GENERAL: At the time that she was evaluated she appeared to be afebrile without distress.  VITAL SIGNS: Temperature 98.5, pulse 70, respiratory rate 20, blood pressure 138/76.  The saturations were 99%.  This was on 2 liters.  NECK:  Supple. There is no JVD. No adenopathy.  No thyromegaly.  CHEST:  Had coarse breath sounds with some rales, no rhonchi. Expansion was equal.  CARDIOVASCULAR: S1, S2 is normal. Regular rhythm. No gallop or rub.  ABDOMEN: Soft, nontender.  EXTREMITIES: Without cyanosis or clubbing. Pulses equal.  NEUROLOGIC: Awake and alert. She is moving all 4 extremities. Gait was not checked.  MUSCULOSKELETAL:  Without any active synovitis at this time.   IMPRESSION:  Interstitial lung disease with pulmonary fibrosis.  The results of the biopsy are pending and it will be important to see what the path report shows.  At this time, she will be continued on oxygen therapy. We will continue with pulmonary toilet and hopefully, have the chest tube out and she can be discharged home.  I would be happy to see her back in the office after discharge. Reviewed her current medications, which should be continued.  Will follow further recommendation as needed.      ____________________________ Yevonne PaxSaadat A. Philis Doke, MD sak:ct D: 03/13/2012 13:46:36 ET T: 03/13/2012 14:02:10 ET JOB#: [id number]  cc: Yevonne PaxSaadat A. Enos Muhl, MD, <Dictator> Yevonne PaxSAADAT A Tunya Held MD ELECTRONICALLY SIGNED 03/21/2012 11:18

## 2014-07-04 NOTE — Op Note (Signed)
PATIENT NAME:  [name], Shannon S MR#:  [id number] DATE OF BIRTH:  1940-12-25  DATE OF PROCEDURE:  03/12/2012  SURGEON: Jasmine Decemberimothy E Cornell Gaber, MD.   ASSISTANT: Natale LayMark Bird, MD.    PREOPERATIVE DIAGNOSIS: Interstitial lung disease.   POSTOPERATIVE DIAGNOSIS: Interstitial lung disease.   OPERATIONS PERFORMED:  1.  Preoperative bronchoscopy to assess endobronchial anatomy and for bronchoalveolar lavage right lower lobe.  2.  Right thoracoscopy with biopsy of middle and lower lobes.   INDICATIONS FOR PROCEDURE: The patient is a 74 year old white female with a history of cough, shortness of breath and some mild bronchiectasis on a CT scan. This has been persistent despite intensive therapy. The patient was apprised of the indications and risks of a lung biopsy. She gave her informed consent.   DESCRIPTION OF PROCEDURE: The patient was brought to the operating suite and placed in the supine position. General endotracheal anesthesia was given through a double-lumen tube. Preoperative bronchoscopy was carried out. There was no evidence of endobronchial tumor. There were no purulent secretions or bloody secretions present. A BAL of the right lower lobe was performed and sent for cell count and culture. The patient was then positioned for right thoracoscopy. All pressure points were carefully padded. The patient was prepped and draped in the usual sterile fashion. Three 15 mm ports were created in a triangular fashion on the lower aspect of the right chest. Through these three ports, we could adequately see the entire hemithorax. The external appearance of the lungs was normal. The pleural surface was normal. A generous biopsy of the right lower and right middle lobes was then performed using an endoscopic stapler. The staple lines were hemostatic. A single 28 French chest tube was inserted through the most anterior port and positioned to the apex of the chest. It was brought out through a separate stab wound. All wounds  were then closed in multiple layers of running absorbable suture. The chest tube site was secured with a silk. Prior to closure all the port sites were injected with Marcaine after visualization to insure that there was complete hemostasis. The patient was then rolled in the supine position where she was extubated and taken to the recovery room in stable condition.   ____________________________ Sheppard Plumberimothy E. Thelma Bargeaks, MD teo:th D: 03/12/2012 16:18:11 ET T: 03/12/2012 18:59:21 ET JOB#: [id number]  cc: Sheppard Plumberimothy E. Thelma Bargeaks, MD, <Dictator> Yevonne PaxSaadat A. Khan, MD Jasmine DecemberIMOTHY E Sakari Alkhatib MD ELECTRONICALLY SIGNED 03/15/2012 11:50
